# Patient Record
Sex: Female | Born: 2005 | Race: Black or African American | Hispanic: No | Marital: Single | Smoking: Never smoker
Health system: Southern US, Community
[De-identification: ages and names within clinical notes are randomized; demographics above are authoritative.]

## PROBLEM LIST (undated history)

## (undated) DIAGNOSIS — M419 Scoliosis, unspecified: Secondary | ICD-10-CM

## (undated) HISTORY — DX: Scoliosis, unspecified: M41.9

---
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
## 2023-01-02 ENCOUNTER — Telehealth: Payer: Self-pay | Admitting: Family

## 2023-01-02 NOTE — Telephone Encounter (Signed)
Pt mom is calling and her daughter would like to transfer to dr banks

## 2023-01-13 ENCOUNTER — Ambulatory Visit: Payer: 59 | Admitting: Cardiovascular Disease

## 2023-02-14 NOTE — Progress Notes (Deleted)
Cardiology Clinic Note   Patient Name: Nicole Russo Date of Encounter: 02/14/2023  Primary Care Provider:  Dulce Sellar, NP Primary Cardiologist:  None  Patient Profile    Nicole Russo 17 year old female presents the clinic today for follow-up evaluation of her syncope.  Past Medical History    Past Medical History:  Diagnosis Date   Scoliosis    No past surgical history on file.  Allergies  Allergies  Allergen Reactions   Milk-Related Compounds     Gets constipated.    History of Present Illness    Nicole Russo has a PMH of scoliosis and syncope.  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  She was seen and evaluated at [facility] in October after she had experienced an episode of syncope while at school.  She was walking to class, had a stomachache and headache.  She fell to the ground and had a very brief loss of consciousness.  She was taken to the emergency room at [facility] 07/11/2022.  Her troponins, CBC and CMP were normal.  Her EKG was also normal.  She was evaluated by Prudencio Burly at [facility].  It was felt that her symptoms were related to vasovagal syncope.  She was instructed to increase fluids and sodium consumption.  She had another episode of presyncope/syncope that was short-lived while she was sitting in a chair.  She became nauseated vomited, her eyes became glassy and she blacked out briefly.  She denied chest pain, shortness of breath with the episode.  She was evaluated at [facility], ED on 10/28/2022.  Her EKG showed sinus rhythm with a rate of 99 bpm nonspecific ST and T wave changes.  Her PR interval and QTc intervals were normal.  Her lab work showed a decreased CO2 at 21.  She was also noted to have tachycardia with a heart rate up to 117.  She was unaware of hyperventilating.  She reported that she had started to drink Ensure.  She admitted to frequent vomiting.  She denied bulimia.  Her  mother reported that she does not eat well.  Her mother reported that at times she would only have crackers.  She was seen and evaluated by Dr. Tresa Endo 11/01/2022.  During that time he ordered CMP, magnesium, echocardiogram which were all reassuring.  A cardiac event monitor was also ordered but has not yet resulted.  Her EKG showed normal sinus rhythm 97 bpm.  Her symptoms were felt to be related to vasovagal response.  The importance of adequate nutrition and maintain hydration were reviewed.  She presents to the clinic today for follow-up evaluation and states***.  *** denies chest pain, shortness of breath, lower extremity edema, fatigue, palpitations, melena, hematuria, hemoptysis, diaphoresis, weakness, presyncope, syncope, orthopnea, and PND.  Syncope-denies further episodes.  Recent lab work unremarkable.  Echocardiogram shows normal LVEF, trivial mitral valve regurgitation and no other significant valvular abnormalities.  Previously felt to be related to vasovagal response. Maintain p.o. hydration Change positions slowly Well-balanced diet with adequate calories  Decreased appetite-encouraged small more frequent meals and maintaining hydration.  Low systolic blood pressure-BP today***. Increase p.o. hydration Liberalize sodium intake  Disposition: Follow-up with Dr. Tresa Endo or me in 12 months.  Home Medications    Prior to Admission medications   Medication Sig Start Date End Date Taking? Authorizing Provider  ondansetron (ZOFRAN) 4 MG tablet Take 1 tablet (4 mg total) by mouth every 6 (six) hours. 10/29/22  Terald Sleeper, MD    Family History    No family history on file. She indicated that her mother is alive. She indicated that her father is alive. She indicated that both of her sisters are alive.  Social History    Social History   Socioeconomic History   Marital status: Single    Spouse name: Not on file   Number of children: Not on file   Years of education: Not  on file   Highest education level: Not on file  Occupational History   Not on file  Tobacco Use   Smoking status: Never   Smokeless tobacco: Never  Vaping Use   Vaping Use: Never used  Substance and Sexual Activity   Alcohol use: No   Drug use: No   Sexual activity: Never  Other Topics Concern   Not on file  Social History Narrative   Lives at home with mother, father and 2 sisters.   Attends middle college at [employer].   12th grade.  [employer]   Works at [employer] 3-4 times a week.   Social Determinants of Health   Financial Resource Strain: Not on file  Food Insecurity: Not on file  Transportation Needs: Not on file  Physical Activity: Not on file  Stress: Not on file  Social Connections: Not on file  Intimate Partner Violence: Not on file     Review of Systems    General:  No chills, fever, night sweats or weight changes.  Cardiovascular:  No chest pain, dyspnea on exertion, edema, orthopnea, palpitations, paroxysmal nocturnal dyspnea. Dermatological: No rash, lesions/masses Respiratory: No cough, dyspnea Urologic: No hematuria, dysuria Abdominal:   No nausea, vomiting, diarrhea, bright red blood per rectum, melena, or hematemesis Neurologic:  No visual changes, wkns, changes in mental status. All other systems reviewed and are otherwise negative except as noted above.  Physical Exam    VS:  There were no vitals taken for this visit. , BMI There is no height or weight on file to calculate BMI. GEN: Well nourished, well developed, in no acute distress. HEENT: normal. Neck: Supple, no JVD, carotid bruits, or masses. Cardiac: RRR, no murmurs, rubs, or gallops. No clubbing, cyanosis, edema.  Radials/DP/PT 2+ and equal bilaterally.  Respiratory:  Respirations regular and unlabored, clear to auscultation bilaterally. GI: Soft, nontender, nondistended, BS + x 4. MS: no deformity or atrophy. Skin: warm and dry, no rash. Neuro:  Strength and  sensation are intact. Psych: Normal affect.  Accessory Clinical Findings    Recent Labs: 10/28/2022: Hemoglobin 13.1; Platelets 268; TSH 0.444 11/01/2022: ALT 20; BUN 6; Creatinine, Ser 0.69; Magnesium 1.8; Potassium 4.1; Sodium 141   Recent Lipid Panel    Component Value Date/Time   CHOL 156 02/21/2022 1135   CHOL 133 12/17/2019 0000   TRIG 45 02/21/2022 1135   HDL 75 02/21/2022 1135   HDL 74 12/17/2019 0000   CHOLHDL 2.1 02/21/2022 1135   LDLCALC 68 02/21/2022 1135    No BP recorded.  {Refresh Note OR Click here to enter BP  :1}***    ECG personally reviewed by me today- *** - No acute changes  Echocardiogram 11/22/2022  IMPRESSIONS     1. Left ventricular ejection fraction, by estimation, is 50 to 55%. Left  ventricular ejection fraction by 2D MOD biplane is 50 %. The left  ventricle has low normal function. The left ventricle has no regional wall  motion abnormalities. Left ventricular  diastolic parameters were  normal.   2. Right ventricular systolic function is normal. The right ventricular  size is normal. Tricuspid regurgitation signal is inadequate for assessing  PA pressure.   3. The mitral valve is normal in structure. Trivial mitral valve  regurgitation. No evidence of mitral stenosis.   4. The aortic valve is tricuspid. Aortic valve regurgitation is not  visualized. No aortic stenosis is present.   5. The inferior vena cava is normal in size with greater than 50%  respiratory variability, suggesting right atrial pressure of 3 mmHg.   FINDINGS   Left Ventricle: Left ventricular ejection fraction, by estimation, is 50  to 55%. Left ventricular ejection fraction by 2D MOD biplane is 50 %. The  left ventricle has low normal function. The left ventricle has no regional  wall motion abnormalities.  Global longitudinal strain performed but not reported based on interpreter  judgement due to suboptimal tracking. 3D left ventricular ejection  fraction analysis  performed but not reported based on interpreter  judgement due to suboptimal tracking. The left   ventricular internal cavity size was normal in size. There is no left  ventricular hypertrophy. Left ventricular diastolic parameters were  normal.   Right Ventricle: The right ventricular size is normal. No increase in  right ventricular wall thickness. Right ventricular systolic function is  normal. Tricuspid regurgitation signal is inadequate for assessing PA  pressure. The tricuspid regurgitant  velocity is 1.79 m/s, and with an assumed right atrial pressure of 3 mmHg,  the estimated right ventricular systolic pressure is 15.8 mmHg.   Left Atrium: Left atrial size was normal in size.   Right Atrium: Right atrial size was normal in size.   Pericardium: There is no evidence of pericardial effusion.   Mitral Valve: The mitral valve is normal in structure. Trivial mitral  valve regurgitation. No evidence of mitral valve stenosis.   Tricuspid Valve: The tricuspid valve is normal in structure. Tricuspid  valve regurgitation is trivial. No evidence of tricuspid stenosis.   Aortic Valve: The aortic valve is tricuspid. Aortic valve regurgitation is  not visualized. No aortic stenosis is present.   Pulmonic Valve: The pulmonic valve was normal in structure. Pulmonic valve  regurgitation is trivial. No evidence of pulmonic stenosis.   Aorta: The aortic root is normal in size and structure.   Venous: The inferior vena cava is normal in size with greater than 50%  respiratory variability, suggesting right atrial pressure of 3 mmHg.   IAS/Shunts: No atrial level shunt detected by color flow Doppler.   Cardiac event monitor 11/01/2022 Waiting on interpretation and results.  Assessment & Plan   1.  ***   Thomasene Ripple. Abigial Newville NP-C     02/14/2023, 7:47 AM [facility] [address] Office [phone] Fax [phone]    I spent***minutes  examining this patient, reviewing medications, and using patient centered shared decision making involving her cardiac care.  Prior to her visit I spent greater than 20 minutes reviewing her past medical history,  medications, and prior cardiac tests.

## 2023-02-15 NOTE — Telephone Encounter (Signed)
Ok

## 2023-02-16 ENCOUNTER — Ambulatory Visit: Payer: 59 | Attending: Cardiovascular Disease | Admitting: General Practice

## 2023-02-16 NOTE — Telephone Encounter (Signed)
Lmom for pt to sch an appt with dr banks

## 2023-03-02 NOTE — Telephone Encounter (Signed)
Mailbox full unable to leave message.

## 2023-06-07 ENCOUNTER — Ambulatory Visit: Payer: 59 | Admitting: Family

## 2023-06-07 ENCOUNTER — Encounter: Payer: Self-pay | Admitting: Family

## 2023-06-07 VITALS — BP 102/66 | HR 82 | Temp 97.9°F | Wt 97.0 lb

## 2023-06-07 DIAGNOSIS — R053 Chronic cough: Secondary | ICD-10-CM | POA: Diagnosis not present

## 2023-06-07 DIAGNOSIS — R0982 Postnasal drip: Secondary | ICD-10-CM | POA: Diagnosis not present

## 2023-06-07 MED ORDER — LEVOCETIRIZINE DIHYDROCHLORIDE 5 MG PO TABS: 5.0000 mg | ORAL_TABLET | Freq: Every evening | ORAL | 0 refills | Status: AC

## 2023-06-07 MED ORDER — TRIAMCINOLONE ACETONIDE 55 MCG/ACT NA AERO: 1.0000 | INHALATION_SPRAY | Freq: Every day | NASAL | 2 refills | Status: AC

## 2023-06-07 MED ORDER — METHYLPREDNISOLONE ACETATE 40 MG/ML IJ SUSP
40.0000 mg | Freq: Once | INTRAMUSCULAR | Status: AC
  Administered 2023-06-07: 40 mg via INTRAMUSCULAR

## 2023-06-07 MED ORDER — METHYLPREDNISOLONE ACETATE 40 MG/ML IJ SUSP: 60.0000 mg | Freq: Once | INTRAMUSCULAR | Status: DC

## 2023-06-07 NOTE — Progress Notes (Signed)
Patient ID: Nicole Russo, female    DOB: Jul 28, 2006, 17 y.o.   MRN: [id number]  Chief Complaint  Patient presents with   Cough    X 3 weeks. Tx with Tessalon pearles with no relief.     HPI:      Persistent cough:  The patient presents with a chief complaint of a persistent dry cough for three weeks. The cough is not accompanied by any other symptoms such as fever or sore throat. She reports difficulty sleeping due to the cough, which worsens when lying down. The patient denies any history of asthma or inhaler use and has been residing in the same area for a while. She does not report any recent changes in outdoor activities or exercise levels. She denies any heartburn, chest pain after eating, or sneezing, but reports an itchy throat at times. She also has a sensation of needing to clear her throat frequently.    Assessment & Plan:  1. Postnasal drip- Given steroid injection in office. Sending levocetirizine (Zyrtec) 5 mg daily and Nasacort nasal spray (Nasacort) for three days, one spray in each nostril twice daily, then reduce to one spray daily for one to two weeks. Patient may adjust usage based on symptom relief and seasonal allergen exposure. Advise the patient to prop up with pillows while sleeping. Follow up if symptoms do not improve or worsen.  - levocetirizine (XYZAL) 5 MG tablet; Take 1 tablet (5 mg total) by mouth every evening.  Dispense: 90 tablet; Refill: 0 - triamcinolone (NASACORT) 55 MCG/ACT AERO nasal inhaler; Place 1 spray into the nose daily. Start with 1 spray each side twice a day for 3 days, then reduce to daily.  Dispense: 1 each; Refill: 2 - methylPREDNISolone acetate (DEPO-MEDROL) injection 60 mg  Subjective:    Outpatient Medications Prior to Visit  Medication Sig Dispense Refill   ondansetron (ZOFRAN) 4 MG tablet Take 1 tablet (4 mg total) by mouth every 6 (six) hours. (Patient not taking: Reported on 06/07/2023) 12 tablet 0   No facility-administered  medications prior to visit.   Past Medical History:  Diagnosis Date   Scoliosis    No past surgical history on file. Allergies  Allergen Reactions   Milk-Related Compounds     Gets constipated.      Objective:    Physical Exam Vitals and nursing note reviewed.  Constitutional:      Appearance: Normal appearance. She is not ill-appearing.     Interventions: Face mask in place.  HENT:     Right Ear: Tympanic membrane and ear canal normal.     Left Ear: Tympanic membrane and ear canal normal.     Nose:     Right Sinus: No frontal sinus tenderness.     Left Sinus: No frontal sinus tenderness.     Mouth/Throat:     Mouth: Mucous membranes are moist.     Pharynx: No pharyngeal swelling, oropharyngeal exudate, posterior oropharyngeal erythema or uvula swelling.     Tonsils: No tonsillar exudate or tonsillar abscesses.  Cardiovascular:     Rate and Rhythm: Normal rate and regular rhythm.  Pulmonary:     Effort: Pulmonary effort is normal.     Breath sounds: Normal breath sounds.  Musculoskeletal:        General: Normal range of motion.  Lymphadenopathy:     Head:     Right side of head: No preauricular or posterior auricular adenopathy.     Left side of head: No preauricular  or posterior auricular adenopathy.     Cervical: No cervical adenopathy.  Skin:    General: Skin is warm and dry.  Neurological:     Mental Status: She is alert.  Psychiatric:        Mood and Affect: Mood normal.        Behavior: Behavior normal.    BP 102/66 (BP Location: Right Arm, Patient Position: Sitting, Cuff Size: Large)   Pulse 82   Temp 97.9 F (36.6 C) (Temporal)   Wt 97 lb (44 kg)   LMP 06/02/2023   SpO2 98%   BMI 18.33 kg/m  Wt Readings from Last 3 Encounters:  06/07/23 97 lb (44 kg) (2%, Z= -2.08)*  11/17/22 100 lb 6.4 oz (45.5 kg) (4%, Z= -1.72)*  11/04/22 101 lb 6 oz (46 kg) (5%, Z= -1.63)*   * Growth percentiles are based on CDC (Girls, 2-20 Years) data.       Dulce Sellar, NP

## 2023-06-07 NOTE — Patient Instructions (Addendum)
It was very nice to see you today!   I have sent over 2 medicines to help with your cough that I believe is due to the large amount of mucus in your throat. Start these today. We also gave you a steroid shot to help your immune system calm down and ease your cough.  Let me know if you are no better in 1-2 weeks.      PLEASE NOTE:  If you had any lab tests please let us know if you have not heard back within a few days. You may see your results on MyChart before we have a chance to review them but we will give you a call once they are reviewed by [place]. If we ordered any referrals today, please let us know if you have not heard from their office within the next week.

## 2023-06-08 NOTE — Addendum Note (Signed)
Addended byDulce Sellar on: 06/08/2023 12:02 AM   Modules accepted: Level of Service

## 2023-07-06 ENCOUNTER — Encounter: Payer: Self-pay | Admitting: *Deleted

## 2024-06-14 ENCOUNTER — Ambulatory Visit: Admitting: Family

## 2024-07-12 ENCOUNTER — Encounter: Payer: Self-pay | Admitting: *Deleted

## 2024-10-07 ENCOUNTER — Telehealth: Payer: Self-pay | Admitting: Family

## 2024-10-07 NOTE — Telephone Encounter (Signed)
 Notes reviewed.  ?

## 2024-10-07 NOTE — Telephone Encounter (Signed)
Spoke w/pt and confirmed appt.

## 2024-10-07 NOTE — Telephone Encounter (Signed)
 Spoke with pt mother she said that the daughter has had a dry cough for (3 weeks) no blood or phlegm, triage isn't needed. aw

## 2024-10-08 ENCOUNTER — Telehealth: Payer: Self-pay | Admitting: Family

## 2024-10-08 NOTE — Telephone Encounter (Signed)
 LVM & sent MyChart message re: change of 10/09/24 appt time from 12 pm to 2:30 pm

## 2024-10-09 ENCOUNTER — Ambulatory Visit: Admitting: Family

## 2024-10-11 ENCOUNTER — Ambulatory Visit: Admitting: Family

## 2024-10-11 VITALS — BP 92/54 | HR 93 | Temp 98.0°F | Ht 61.0 in | Wt 97.4 lb

## 2024-10-11 DIAGNOSIS — R053 Chronic cough: Secondary | ICD-10-CM | POA: Diagnosis not present

## 2024-10-11 DIAGNOSIS — J101 Influenza due to other identified influenza virus with other respiratory manifestations: Secondary | ICD-10-CM | POA: Diagnosis not present

## 2024-10-11 MED ORDER — METHYLPREDNISOLONE ACETATE 80 MG/ML IJ SUSP
80.0000 mg | Freq: Once | INTRAMUSCULAR | Status: AC
  Administered 2024-10-11: 80 mg via INTRAMUSCULAR

## 2024-10-11 NOTE — Progress Notes (Signed)
 "  Patient ID: Lydiana Milley, female    DOB: 2006-07-17, 18 y.o.   MRN: [id number]  Chief Complaint  Patient presents with   Influenza    Pt was dx with flu A on 12/16. Pt c/o cough with yellow mucus. Pt was not treated with tamiflu. Patient has been dehydrated. Tessalon, ondasteron and   Discussed the use of AI scribe software for clinical note transcription with the patient, who gave verbal consent to proceed.  History of Present Illness Shahla Betsill is a 18 year old female who presents with flu symptoms and dehydration. She is accompanied by her mother.  She developed flu-like symptoms on Saturday with chills, back pain, and cough. Home treatment with fluids and over-the-counter medications did not improve symptoms. By Monday she had a nosebleed, became lightheaded, and fainted, and was evaluated in the ER where she was found to be dehydrated with low blood pressure. In the ER she received two bags of IV fluids. Testing was positive for influenza and negative for COVID. Due to symptom duration she did not receive Tamiflu. She was started on benzonatate and ondansetron , which improved nausea but not the cough. She still has a persistent productive cough with yellow mucus that is worse at night. She has clear nasal discharge with occasional nosebleeds and uses a humidifier for dryness. She has scoliosis with associated back pain. Her blood pressure usually runs in the 90-100 range. Her appetite is reduced and she can only tolerate small amounts of food. She is currently taking benzonatate for cough and ondansetron  once daily for nausea. She had been taking a generic allergy pill earlier in the illness. She works with children. She denies current chest tightness or shortness of breath. Prior chest pain was associated with coughing.  Assessment & Plan Influenza with respiratory manifestations Influenza confirmed by positive test. Symptoms include persistent cough, nasal congestion, and epistaxis.  Current treatment partially effective. - Administered steroid injection, DepoMedrol 80mg  to reduce inflammation and alleviate cough. - Recommended saline nasal spray to moisturize nasal passages and prevent epistaxis. - Advised taking generic antihistamine (claritin/zyrtec ) to reduce congestion. - Suggested pseudoephedrine for congestion, avoiding evening doses. - Advised monitoring for worsening symptoms and to contact provider if these occur.  Dehydration Likely secondary to influenza and inadequate oral intake. Blood pressure low but within normal range. Recent hospitalization for IV fluids. - Encouraged increased oral fluid intake at least 40-50oz daily. - Advised eating small, frequent meals to improve nutritional intake.  General Health Maintenance Discussed flu vaccination. She has not received a flu shot due to concerns about adverse reactions. Recommended vaccination post-recovery. - Recommended flu vaccination once current illness resolves.   Subjective:    Outpatient Medications Prior to Visit  Medication Sig Dispense Refill   benzonatate (TESSALON) 100 MG capsule Take 100 mg by mouth.     ondansetron  (ZOFRAN ) 4 MG tablet Take 1 tablet (4 mg total) by mouth every 6 (six) hours. 12 tablet 0   levocetirizine (XYZAL ) 5 MG tablet Take 1 tablet (5 mg total) by mouth every evening. (Patient not taking: Reported on 10/11/2024) 90 tablet 0   triamcinolone  (NASACORT ) 55 MCG/ACT AERO nasal inhaler Place 1 spray into the nose daily. Start with 1 spray each side twice a day for 3 days, then reduce to daily. (Patient not taking: Reported on 10/11/2024) 1 each 2   No facility-administered medications prior to visit.   Past Medical History:  Diagnosis Date   Scoliosis    No past surgical history  on file. Allergies[1]    Objective:    Physical Exam Vitals and nursing note reviewed.  Constitutional:      Appearance: Normal appearance. She is ill-appearing.     Interventions: Face  mask in place.  HENT:     Right Ear: Tympanic membrane and ear canal normal.     Left Ear: Tympanic membrane and ear canal normal.     Nose: Congestion and rhinorrhea present.     Right Sinus: No frontal sinus tenderness.     Left Sinus: No frontal sinus tenderness.     Mouth/Throat:     Mouth: Mucous membranes are moist.     Pharynx: No pharyngeal swelling, oropharyngeal exudate, posterior oropharyngeal erythema or uvula swelling.     Tonsils: No tonsillar exudate or tonsillar abscesses.  Cardiovascular:     Rate and Rhythm: Normal rate and regular rhythm.  Pulmonary:     Effort: Pulmonary effort is normal.     Breath sounds: Normal breath sounds.  Musculoskeletal:        General: Normal range of motion.  Lymphadenopathy:     Head:     Right side of head: No preauricular or posterior auricular adenopathy.     Left side of head: No preauricular or posterior auricular adenopathy.     Cervical: No cervical adenopathy.  Skin:    General: Skin is warm and dry.  Neurological:     Mental Status: She is alert.  Psychiatric:        Mood and Affect: Mood normal.        Behavior: Behavior normal.    BP (!) 92/54 (BP Location: Left Arm, Patient Position: Sitting, Cuff Size: Normal)   Pulse 93   Temp 98 F (36.7 C)   Ht 5' 1 (1.549 m)   Wt 97 lb 6.4 oz (44.2 kg)   LMP 10/04/2024 (Exact Date)   SpO2 93%   BMI 18.40 kg/m  Wt Readings from Last 3 Encounters:  10/11/24 97 lb 6.4 oz (44.2 kg)  06/07/23 97 lb (44 kg) (2%, Z= -2.08)*  11/17/22 100 lb 6.4 oz (45.5 kg) (4%, Z= -1.72)*   * Growth percentiles are based on CDC (Girls, 2-20 Years) data.      Lucius Krabbe, NP     [1]  Allergies Allergen Reactions   Milk-Related Compounds     Gets constipated.   "
